# Patient Record
Sex: Male | Born: 1990 | Race: Black or African American | Hispanic: No
Health system: Midwestern US, Community
[De-identification: ages and names within clinical notes are randomized; demographics above are authoritative.]

## PROBLEM LIST (undated history)

## (undated) DIAGNOSIS — J45909 Unspecified asthma, uncomplicated: Secondary | ICD-10-CM

---
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
## 2011-08-23 ENCOUNTER — Emergency Department (HOSPITAL_COMMUNITY)
Admission: EM | Admit: 2011-08-23 | Discharge: 2011-08-24 | Disposition: A | Discharge status: Home / Self Care | Payer: Self-pay | Attending: Emergency Medicine | Admitting: Emergency Medicine

## 2011-08-23 ENCOUNTER — Emergency Department (HOSPITAL_COMMUNITY): Payer: Self-pay

## 2011-08-23 DIAGNOSIS — R51 Headache: Secondary | ICD-10-CM | POA: Insufficient documentation

## 2011-08-23 DIAGNOSIS — S0100XA Unspecified open wound of scalp, initial encounter: Secondary | ICD-10-CM | POA: Insufficient documentation

## 2011-08-23 DIAGNOSIS — S0120XA Unspecified open wound of nose, initial encounter: Secondary | ICD-10-CM | POA: Insufficient documentation

## 2011-08-23 DIAGNOSIS — R404 Transient alteration of awareness: Secondary | ICD-10-CM | POA: Insufficient documentation

## 2011-08-23 DIAGNOSIS — S02400A Malar fracture unspecified, initial encounter for closed fracture: Secondary | ICD-10-CM | POA: Insufficient documentation

## 2011-08-23 DIAGNOSIS — S02401A Maxillary fracture, unspecified, initial encounter for closed fracture: Secondary | ICD-10-CM | POA: Insufficient documentation

## 2011-10-04 ENCOUNTER — Emergency Department (HOSPITAL_COMMUNITY)
Admission: EM | Admit: 2011-10-04 | Discharge: 2011-10-04 | Disposition: A | Discharge status: Home / Self Care | Payer: Self-pay | Attending: Emergency Medicine | Admitting: Emergency Medicine

## 2011-10-04 DIAGNOSIS — R21 Rash and other nonspecific skin eruption: Secondary | ICD-10-CM

## 2011-10-04 MED ORDER — FAMOTIDINE 20 MG PO TABS: 20.0000 mg | ORAL_TABLET | Freq: Two times a day (BID) | ORAL | Status: AC

## 2011-10-04 MED ORDER — PREDNISONE 10 MG PO TABS: ORAL_TABLET | ORAL | Status: AC

## 2011-10-04 MED ORDER — PERMETHRIN 5 % EX CREA: TOPICAL_CREAM | CUTANEOUS | Status: AC

## 2011-10-04 NOTE — ED Provider Notes (Cosign Needed)
History     CSN: [id number] Arrival date & time: 10/04/2011  7:53 PM   First MD Initiated Contact with Patient 10/04/11 2012      Chief Complaint  Patient presents with  . Rash    (Consider location/radiation/quality/duration/timing/severity/associated sxs/prior treatment) HPI  Per patient's girlfriend her toddler son went to [place] with his grandmother and came back with a rash which she didn't develop. They have both been treated for scabies twice however they still have symptoms. Patient moved in with her a couple weeks ago and he also started getting a rash. His rash is only located however on his proximal upper arms bilaterally and around his neck. He states it is itching and denies any fever. There's no pets in the house they did change bar soap that they do not feel like that the cause of the itching. No chemical exposure at work  They have inspected the house for bed bugs. Nothing makes it feel worse nothing makes it feel better.  History reviewed. No pertinent past medical history.  History reviewed. No pertinent past surgical history.  History reviewed. No pertinent family history.  History  Substance Use Topics  . Smoking status: Never Smoker   . Smokeless tobacco: Not on file  . Alcohol Use: No  employed    Review of Systems  All other systems reviewed and are negative.    Allergies  Review of patient's allergies indicates no known allergies.  Home Medications  No current outpatient prescriptions on file.  BP 135/67  Pulse 82  Temp(Src) 97.8 F (36.6 C) (Oral)  Resp 16  Ht 6' (1.829 m)  Wt 145 lb (65.772 kg)  BMI 19.67 kg/m2  SpO2 100%  Physical Exam  Vitals reviewed. Constitutional: He is oriented to person, place, and time. He appears well-developed and well-nourished.  HENT:  Head: Normocephalic and atraumatic.  Eyes: Conjunctivae and EOM are normal. Pupils are equal, round, and reactive to light.  Neck: Normal range of motion. Neck supple.   Pulmonary/Chest: Effort normal.  Neurological: He is alert and oriented to person, place, and time.  Skin: Skin is warm and dry.       Pt  is a few papular type lesions on his upper arms he has some excoriated areas also he has a few lesions at the base of his neck and upper chest. He does not have lesions on his hands or between his fingers or at his waistline however his girlfriend does.    ED Course  Procedures (including critical care time)  Labs Reviewed - No data to display No results found.   The rash sounds like it may be scabies on the description of the rash on the girlfriend however could have an allergic component.  Diagnoses that have been ruled out:  Diagnoses that are still under consideration:  Final diagnoses:  Rash    Medications  diphenhydrAMINE (BENADRYL) 25 mg capsule (not administered)  predniSONE (DELTASONE) 10 MG tablet (not administered)  famotidine (PEPCID) 20 MG tablet (not administered)  permethrin (ELIMITE) 5 % cream (not administered)   Devoria Albe, MD, FACEP    MDM          Ward Givens, MD 10/04/11 2151

## 2011-10-04 NOTE — ED Notes (Signed)
Pt presents with rash to generalized body. Pt unsure of cause.

## 2011-10-04 NOTE — ED Notes (Signed)
C/o rash.

## 2012-09-27 ENCOUNTER — Encounter (HOSPITAL_COMMUNITY): Payer: Self-pay | Admitting: *Deleted

## 2012-09-27 ENCOUNTER — Emergency Department (HOSPITAL_COMMUNITY): Payer: No Typology Code available for payment source

## 2012-09-27 ENCOUNTER — Emergency Department (HOSPITAL_COMMUNITY)
Admission: EM | Admit: 2012-09-27 | Discharge: 2012-09-27 | Disposition: A | Discharge status: Home / Self Care | Payer: No Typology Code available for payment source | Attending: Emergency Medicine | Admitting: Emergency Medicine

## 2012-09-27 DIAGNOSIS — S40019A Contusion of unspecified shoulder, initial encounter: Secondary | ICD-10-CM

## 2012-09-27 DIAGNOSIS — S335XXA Sprain of ligaments of lumbar spine, initial encounter: Secondary | ICD-10-CM | POA: Insufficient documentation

## 2012-09-27 DIAGNOSIS — S39012A Strain of muscle, fascia and tendon of lower back, initial encounter: Secondary | ICD-10-CM

## 2012-09-27 DIAGNOSIS — S20219A Contusion of unspecified front wall of thorax, initial encounter: Secondary | ICD-10-CM

## 2012-09-27 DIAGNOSIS — Y92410 Unspecified street and highway as the place of occurrence of the external cause: Secondary | ICD-10-CM | POA: Insufficient documentation

## 2012-09-27 MED ORDER — HYDROCODONE-ACETAMINOPHEN 5-325 MG PO TABS: 1.0000 | ORAL_TABLET | Freq: Four times a day (QID) | ORAL | Status: AC | PRN

## 2012-09-27 MED ORDER — IBUPROFEN 800 MG PO TABS: 800.0000 mg | ORAL_TABLET | Freq: Three times a day (TID) | ORAL | Status: AC | PRN
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
## 2012-10-02 NOTE — ED Provider Notes (Signed)
Medical screening examination/treatment/procedure(s) were performed by non-physician practitioner and as supervising physician I was immediately available for consultation/collaboration.   Hurman Horn, MD 10/02/12 2124

## 2015-04-05 ENCOUNTER — Emergency Department (HOSPITAL_COMMUNITY): Payer: Self-pay

## 2015-04-05 ENCOUNTER — Emergency Department (HOSPITAL_COMMUNITY)
Admission: EM | Admit: 2015-04-05 | Discharge: 2015-04-06 | Discharge status: Against Medical Advice | Payer: No Typology Code available for payment source | Attending: Emergency Medicine | Admitting: Emergency Medicine

## 2015-04-05 ENCOUNTER — Encounter (HOSPITAL_COMMUNITY): Payer: Self-pay | Admitting: *Deleted

## 2015-04-05 DIAGNOSIS — R0981 Nasal congestion: Secondary | ICD-10-CM | POA: Insufficient documentation

## 2015-04-05 DIAGNOSIS — Z5321 Procedure and treatment not carried out due to patient leaving prior to being seen by health care provider: Secondary | ICD-10-CM

## 2015-04-05 DIAGNOSIS — J45909 Unspecified asthma, uncomplicated: Secondary | ICD-10-CM | POA: Insufficient documentation

## 2015-04-05 DIAGNOSIS — R05 Cough: Secondary | ICD-10-CM | POA: Insufficient documentation

## 2015-04-05 HISTORY — DX: Unspecified asthma, uncomplicated: J45.909

## 2015-04-05 NOTE — ED Notes (Signed)
Pt in c/o cough and congestion for the last few days, states he has been taking allergy medication without relief, no distress noted

## 2015-04-05 NOTE — ED Notes (Signed)
Pts name called for a room per RN no answer

## 2017-03-24 ENCOUNTER — Emergency Department (HOSPITAL_COMMUNITY): Payer: Self-pay

## 2017-03-24 ENCOUNTER — Encounter (HOSPITAL_COMMUNITY): Payer: Self-pay

## 2017-03-24 ENCOUNTER — Emergency Department (HOSPITAL_COMMUNITY)
Admission: EM | Admit: 2017-03-24 | Discharge: 2017-03-24 | Disposition: A | Discharge status: Home / Self Care | Payer: Self-pay | Attending: Emergency Medicine | Admitting: Emergency Medicine

## 2017-03-24 DIAGNOSIS — R0789 Other chest pain: Secondary | ICD-10-CM | POA: Insufficient documentation

## 2017-03-24 DIAGNOSIS — J45909 Unspecified asthma, uncomplicated: Secondary | ICD-10-CM | POA: Insufficient documentation

## 2017-03-24 DIAGNOSIS — J069 Acute upper respiratory infection, unspecified: Secondary | ICD-10-CM | POA: Insufficient documentation

## 2017-03-24 DIAGNOSIS — Z79899 Other long term (current) drug therapy: Secondary | ICD-10-CM | POA: Insufficient documentation

## 2017-03-24 MED ORDER — OXYMETAZOLINE HCL 0.05 % NA SOLN
1.0000 | Freq: Once | NASAL | Status: AC
  Administered 2017-03-24: 1 via NASAL
  Filled 2017-03-24: qty 15

## 2017-03-24 MED ORDER — ALBUTEROL SULFATE (2.5 MG/3ML) 0.083% IN NEBU
5.0000 mg | INHALATION_SOLUTION | Freq: Once | RESPIRATORY_TRACT | Status: AC
Start: 2017-03-24 — End: 2017-03-24
  Administered 2017-03-24: 5 mg via RESPIRATORY_TRACT
  Filled 2017-03-24: qty 6

## 2017-03-24 MED ORDER — IPRATROPIUM BROMIDE 0.02 % IN SOLN
0.5000 mg | Freq: Once | RESPIRATORY_TRACT | Status: AC
  Administered 2017-03-24: 0.5 mg via RESPIRATORY_TRACT
  Filled 2017-03-24: qty 2.5

## 2017-03-24 MED ORDER — ALBUTEROL SULFATE HFA 108 (90 BASE) MCG/ACT IN AERS
2.0000 | INHALATION_SPRAY | RESPIRATORY_TRACT | Status: DC | PRN
  Administered 2017-03-24: 2 via RESPIRATORY_TRACT
  Filled 2017-03-24: qty 6.7

## 2017-03-24 NOTE — ED Triage Notes (Signed)
States nasal congestion/drainage for a couple of days and worse today lungs clear able to speak in full sentences non productive cough noted. No fever.

## 2017-03-24 NOTE — ED Provider Notes (Signed)
[facility]-EMERGENCY DEPT Provider Note   CSN: [id number] Arrival date & time: 03/24/17  0341     History   Chief Complaint Chief Complaint  Patient presents with  . Nasal Congestion    HPI Joel Olson is a 26 y.o. male.  Patient with history of bronchitis and asthma, no home medications, presents with 2 day history of nasal congestion, sore throat, wheezing, chest tightness, cough, chills and subjective fevers. Denies any sick contacts. No treatments prior to arrival. No nausea, vomiting, or diarrhea. No abdominal pain or urinary symptoms. No skin rash. Onset of symptoms acute. Course is constant.      Past Medical History:  Diagnosis Date  . Asthma     There are no active problems to display for this patient.   History reviewed. No pertinent surgical history.     Home Medications    Prior to Admission medications   Medication Sig Start Date End Date Taking? Authorizing Provider  diphenhydrAMINE (BENADRYL) 25 mg capsule Take 25 mg by mouth as needed. For itching     Historical Provider, MD  famotidine (PEPCID) 20 MG tablet Take 1 tablet (20 mg total) by mouth 2 (two) times daily. 10/04/11 10/03/12  Devoria Albe, MD  HYDROcodone-acetaminophen (NORCO/VICODIN) 5-325 MG per tablet Take 1 tablet by mouth every 6 (six) hours as needed for pain. 09/27/12   Charlestine Night, PA-C  ibuprofen (ADVIL,MOTRIN) 800 MG tablet Take 1 tablet (800 mg total) by mouth every 8 (eight) hours as needed for pain. 09/27/12   Charlestine Night, PA-C  predniSONE (DELTASONE) 10 MG tablet Take 3 po QD x 3d  then 2 po QD x 3d then 1 po QD x 3d 10/04/11   Devoria Albe, MD    Family History History reviewed. No pertinent family history.  Social History Social History  Substance Use Topics  . Smoking status: Never Smoker  . Smokeless tobacco: Never Used  . Alcohol use No     Allergies   Patient has no known allergies.   Review of Systems Review of Systems  Constitutional: Positive for  chills. Negative for fatigue and fever (subjective).  HENT: Positive for congestion and sore throat. Negative for ear pain, rhinorrhea and sinus pressure.   Eyes: Negative for redness.  Respiratory: Positive for cough, chest tightness, shortness of breath and wheezing.   Gastrointestinal: Negative for abdominal pain, diarrhea, nausea and vomiting.  Genitourinary: Negative for dysuria.  Musculoskeletal: Negative for myalgias and neck stiffness.  Skin: Negative for rash.  Neurological: Negative for headaches.  Hematological: Negative for adenopathy.     Physical Exam Updated Vital Signs BP (!) 149/82 (BP Location: Left Arm)   Pulse 83   Temp 97.4 F (36.3 C) (Oral)   Resp 18   Ht 6' (1.829 m)   Wt 79.4 kg   SpO2 99%   BMI 23.73 kg/m   Physical Exam  Constitutional: He appears well-developed and well-nourished.  HENT:  Head: Normocephalic and atraumatic.  Right Ear: Tympanic membrane, external ear and ear canal normal.  Left Ear: Tympanic membrane, external ear and ear canal normal.  Nose: Mucosal edema present. No rhinorrhea.  Mouth/Throat: Uvula is midline, oropharynx is clear and moist and mucous membranes are normal. Mucous membranes are not dry. No trismus in the jaw. No uvula swelling. No oropharyngeal exudate, posterior oropharyngeal edema, posterior oropharyngeal erythema or tonsillar abscesses.  Eyes: Conjunctivae are normal. Right eye exhibits no discharge. Left eye exhibits no discharge.  Neck: Normal range of motion. Neck supple.  Cardiovascular: Normal rate, regular rhythm and normal heart sounds.   Pulmonary/Chest: Effort normal and breath sounds normal. No respiratory distress. He has no wheezes. He has no rales.  Slightly decreased air movement  Abdominal: Soft. There is no tenderness.  Musculoskeletal: He exhibits no edema.  Neurological: He is alert.  Skin: Skin is warm and dry.  Psychiatric: He has a normal mood and affect.  Nursing note and vitals  reviewed.    ED Treatments / Results   Radiology Dg Chest 2 View  Result Date: 03/24/2017 CLINICAL DATA:  Chest tightness EXAM: CHEST  2 VIEW COMPARISON:  Chest radiograph 04/05/2015 FINDINGS: The heart size and mediastinal contours are within normal limits. Both lungs are clear. The visualized skeletal structures are unremarkable. IMPRESSION: Clear lungs. Electronically Signed   By: Deatra Robinson M.D.   On: 03/24/2017 04:31    Procedures Procedures (including critical care time)  Medications Ordered in ED Medications  albuterol (PROVENTIL HFA;VENTOLIN HFA) 108 (90 Base) MCG/ACT inhaler 2 puff (2 puffs Inhalation Given 03/24/17 0533)  oxymetazoline (AFRIN) 0.05 % nasal spray 1 spray (1 spray Each Nare Given 03/24/17 0447)  albuterol (PROVENTIL) (2.5 MG/3ML) 0.083% nebulizer solution 5 mg (5 mg Nebulization Given 03/24/17 0431)  ipratropium (ATROVENT) nebulizer solution 0.5 mg (0.5 mg Nebulization Given 03/24/17 0431)     Initial Impression / Assessment and Plan / ED Course  I have reviewed the triage vital signs and the nursing notes.  Pertinent labs & imaging results that were available during my care of the patient were reviewed by me and considered in my medical decision making (see chart for details).     Patient seen and examined. Work-up initiated. Medications ordered.   Vital signs reviewed and are as follows: BP (!) 149/82 (BP Location: Left Arm)   Pulse 83   Temp 97.4 F (36.3 C) (Oral)   Resp 18   Ht 6' (1.829 m)   Wt 79.4 kg   SpO2 99%   BMI 23.73 kg/m   CXR negative. Patient informed. Nasal congestion improved with Afrin. Discussed how to use this more than twice a day for 3 days. Will discharge to home with albuterol inhaler to use as needed for wheezing.  Patient counseled on supportive care for viral URI and s/s to return including worsening symptoms, persistent fever, persistent vomiting, or if they have any other concerns. Urged to see PCP if symptoms  persist for more than 3 days. Patient verbalizes understanding and agrees with plan.    Final Clinical Impressions(s) / ED Diagnoses   Final diagnoses:  Upper respiratory tract infection, unspecified type   Patient with symptoms consistent with a viral syndrome. Vitals are stable, no fever. No signs of dehydration. Lung exam normal, CXR clear. Supportive therapy indicated with return if symptoms worsen.     New Prescriptions New Prescriptions   No medications on file     Renne Crigler, PA-C 03/24/17 6578    Derwood Kaplan, MD 03/25/17 [phone]

## 2017-03-24 NOTE — Discharge Instructions (Signed)
Please read and follow all provided instructions.  Your diagnoses today include:  1. Upper respiratory tract infection, unspecified type     You appear to have an upper respiratory infection (URI). An upper respiratory tract infection, or cold, is a viral infection of the air passages leading to the lungs. It should improve gradually after 5-7 days. You may have a lingering cough that lasts for 2- 4 weeks after the infection.  Tests performed today include:  Vital signs. See below for your results today.   Medications prescribed:   Albuterol inhaler - medication that opens up your airway  Use inhaler as follows: 1-2 puffs with spacer every 4 hours as needed for wheezing, cough, or shortness of breath.    Oxymetazoline - nasal spray for congestion. Do not use for more than 3 days because this medicine can cause rebound congestion.   Take any prescribed medications only as directed. Treatment for your infection is aimed at treating the symptoms. There are no medications, such as antibiotics, that will cure your infection.   Home care instructions:  Follow any educational materials contained in this packet.   Your illness is contagious and can be spread to others, especially during the first 3 or 4 days. It cannot be cured by antibiotics or other medicines. Take basic precautions such as washing your hands often, covering your mouth when you cough or sneeze, and avoiding public places where you could spread your illness to others.   Please continue drinking plenty of fluids.  Use over-the-counter medicines as needed as directed on packaging for symptom relief.  You may also use ibuprofen or tylenol as directed on packaging for pain or fever.  Do not take multiple medicines containing Tylenol or acetaminophen to avoid taking too much of this medication.  Follow-up instructions: Please follow-up with your primary care provider in the next 3 days for further evaluation of your symptoms if  you are not feeling better.   Return instructions:   Please return to the Emergency Department if you experience worsening symptoms.   RETURN IMMEDIATELY IF you develop shortness of breath, confusion or altered mental status, a new rash, become dizzy, faint, or poorly responsive, or are unable to be cared for at home.  Please return if you have persistent vomiting and cannot keep down fluids or develop a fever that is not controlled by tylenol or motrin.    Please return if you have any other emergent concerns.  Additional Information:  Your vital signs today were: BP (!) 149/82 (BP Location: Left Arm)    Pulse 83    Temp 97.4 F (36.3 C) (Oral)    Resp 18    Ht 6' (1.829 m)    Wt 79.4 kg    SpO2 99%    BMI 23.73 kg/m  If your blood pressure (BP) was elevated above 135/85 this visit, please have this repeated by your doctor within one month. --------------

## 2017-03-24 NOTE — ED Notes (Signed)
Respiratory notified of nebs ordered.

## 2017-03-24 NOTE — ED Notes (Signed)
Bed: [facility] Expected date:  Expected time:  Means of arrival:  Comments: 

## 2017-09-14 IMAGING — CR DG CHEST 2V
2 series · 2 of 2 positions shown · non-contrast
Comparison: Chest radiograph 04/05/2015

CLINICAL DATA: Chest tightness

EXAM:
CHEST  2 VIEW

[w chest pa]
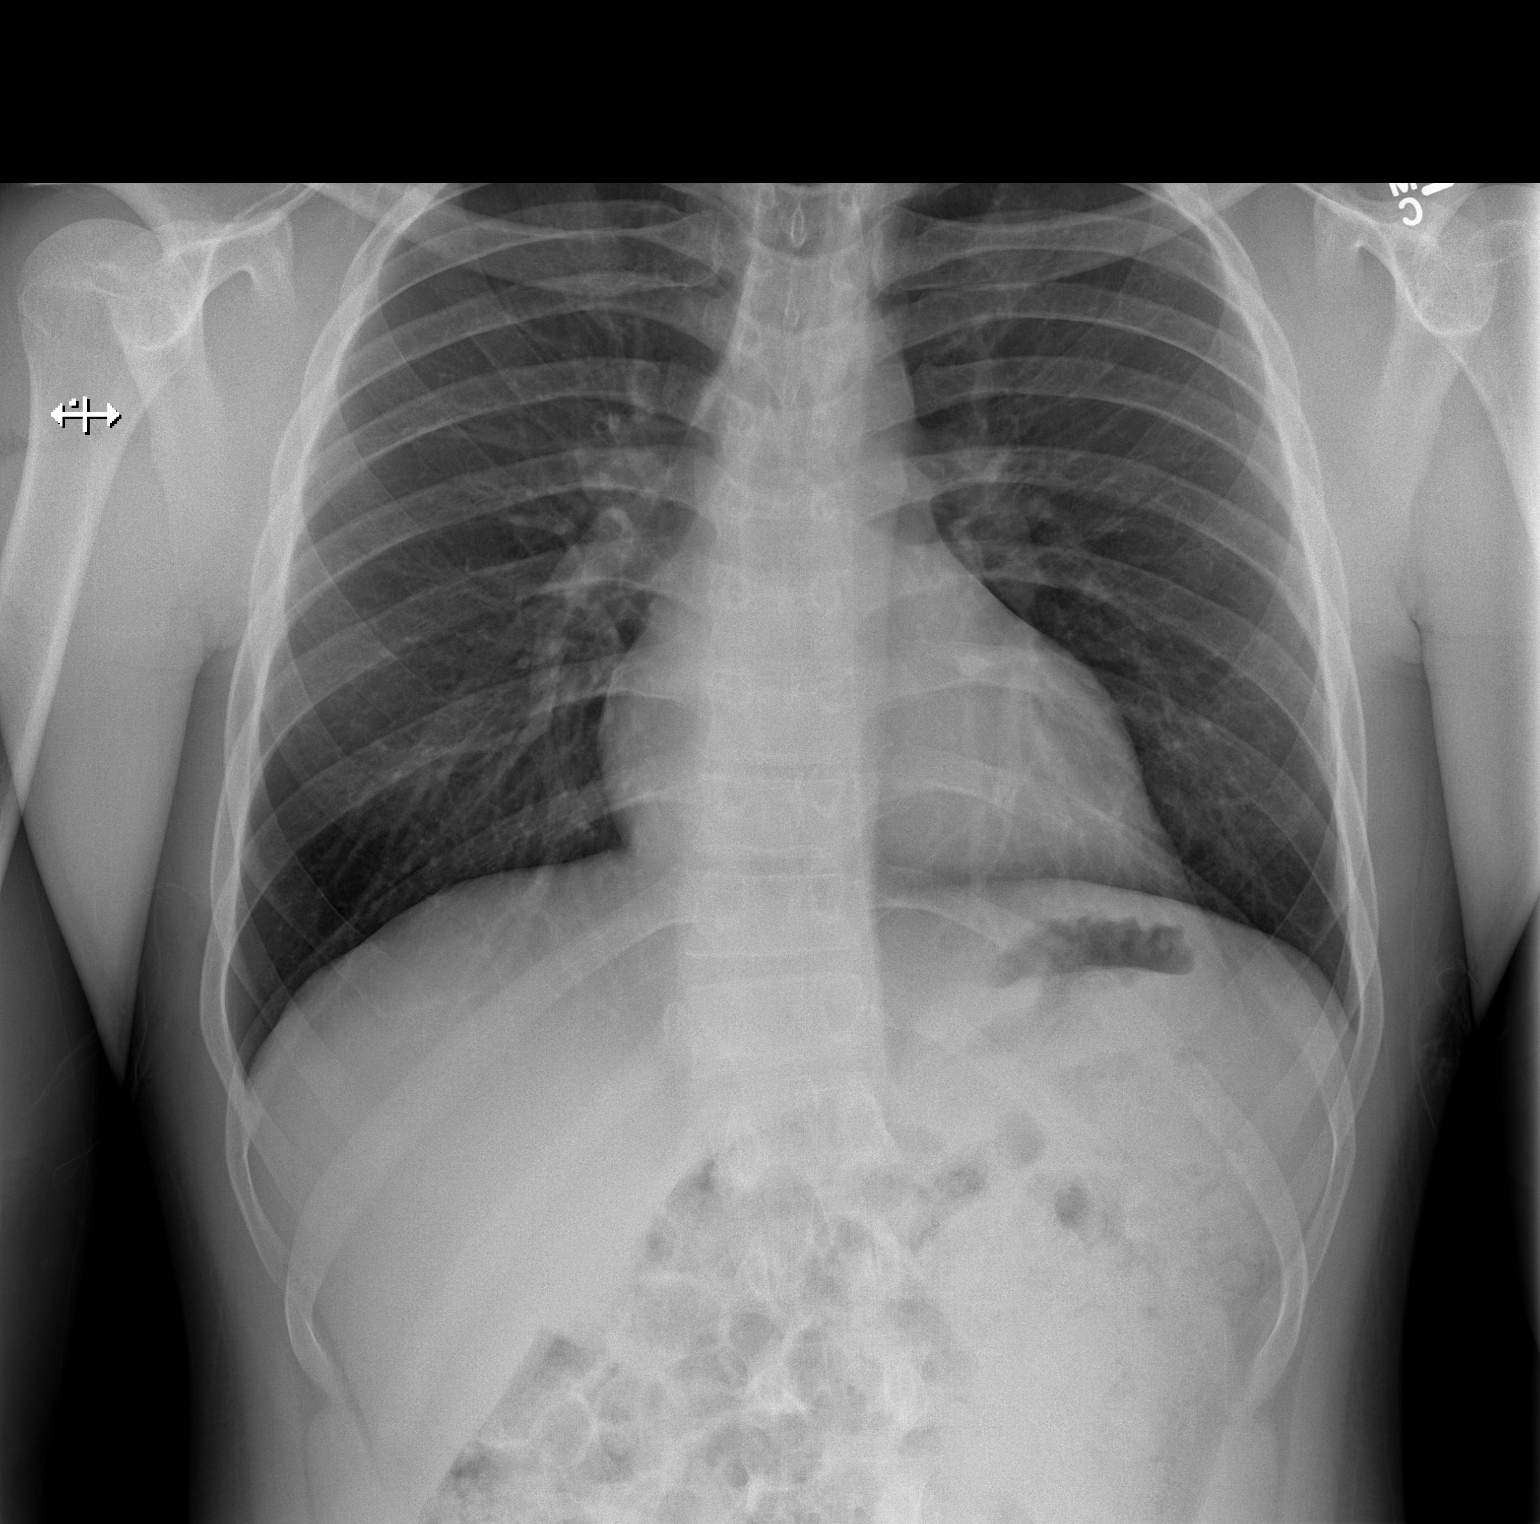

[w chest lat]
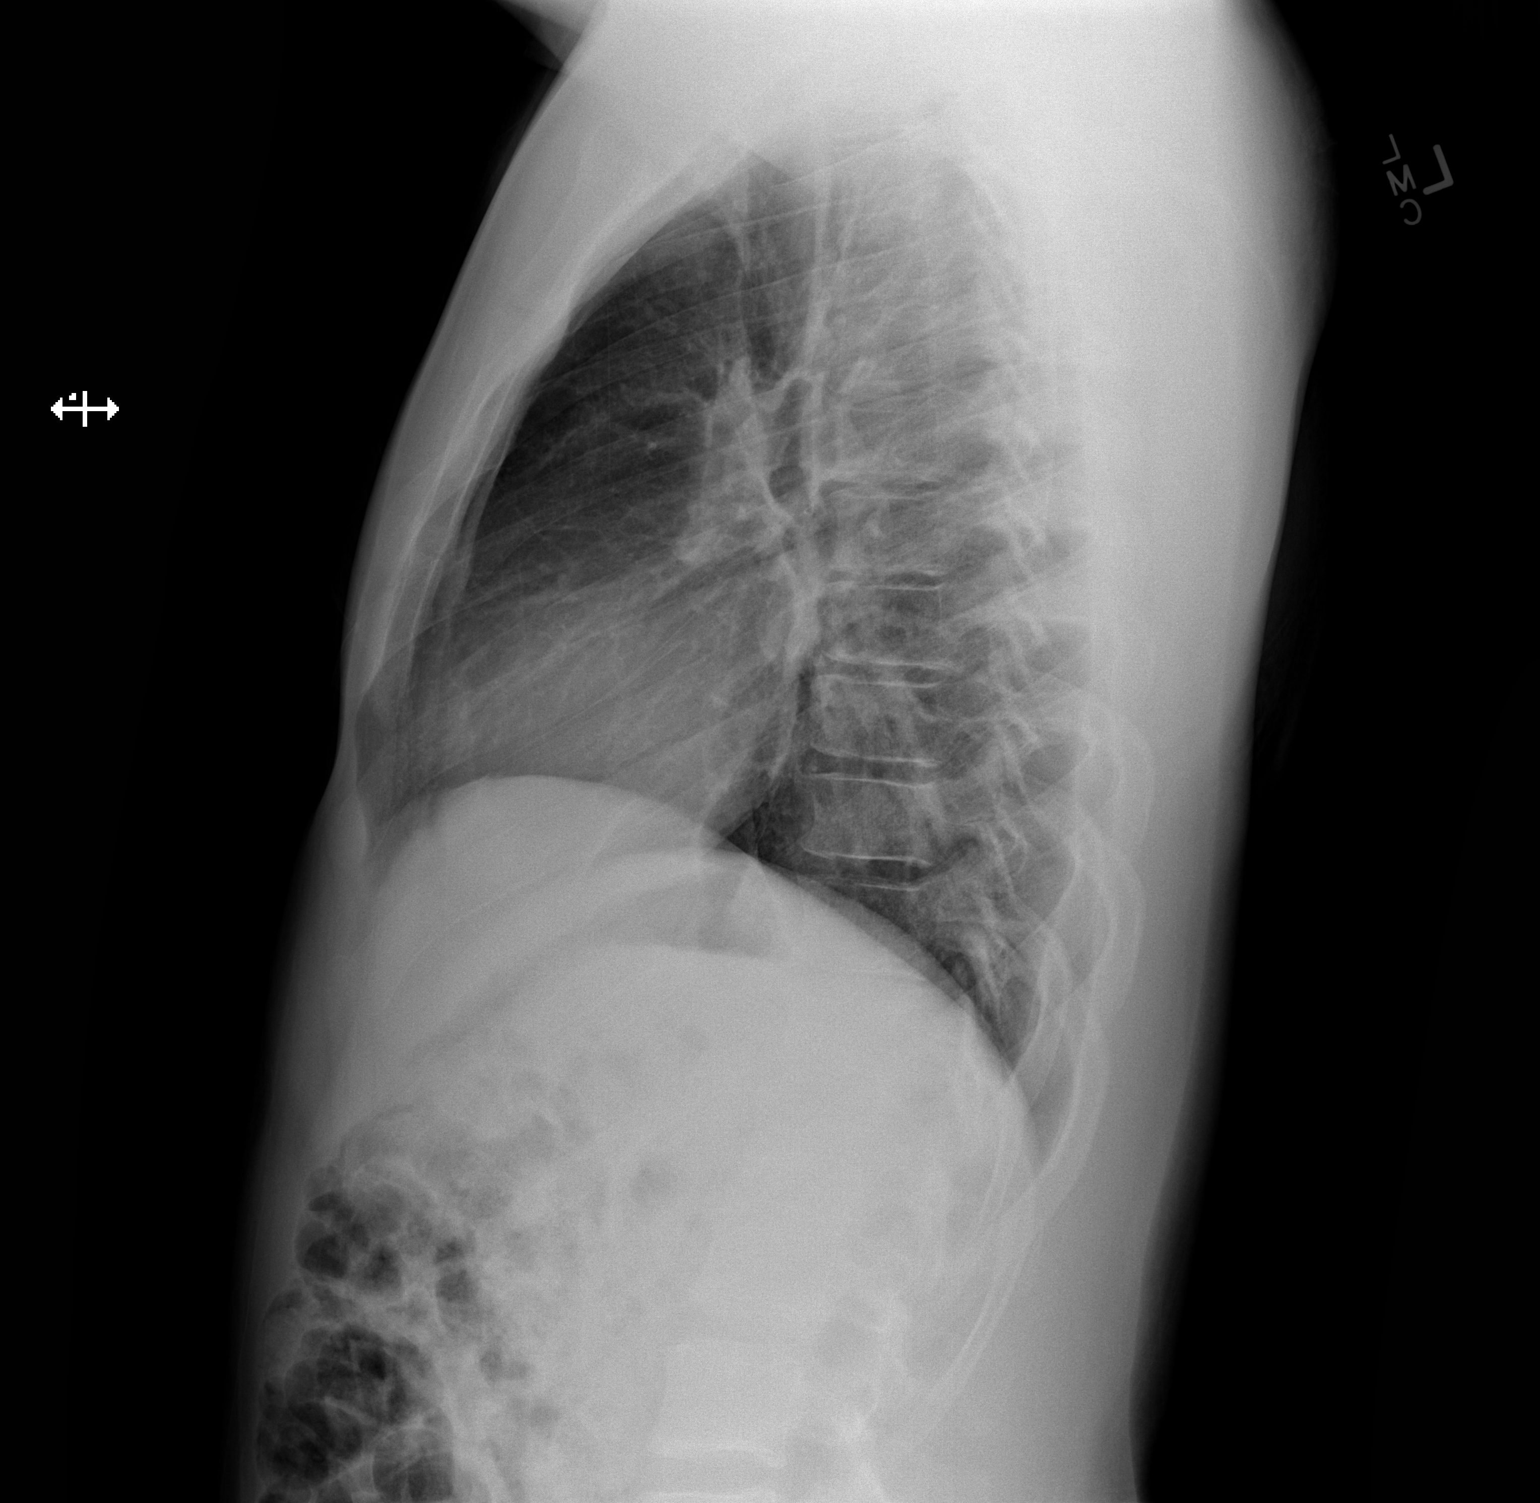

[2 of 2 positions shown; findings below may reference images not displayed]

FINDINGS: The heart size and mediastinal contours are within normal limits.
Both lungs are clear. The visualized skeletal structures are
unremarkable.
IMPRESSION: Clear lungs.

## 2018-05-19 MED ORDER — PSEUDOEPH-BROMPHEN-DM 30-2-10 MG/5ML PO SYRP
2-30-10 MG/5ML | Freq: Four times a day (QID) | ORAL | 0 refills | Status: DC | PRN
Start: 2018-05-19 — End: 2019-04-05

## 2018-05-19 MED ORDER — METHYLPREDNISOLONE ACETATE 40 MG/ML IJ SUSP
40 MG/ML | Freq: Once | INTRAMUSCULAR | Status: AC
Start: 2018-05-19 — End: 2018-05-19
  Administered 2018-05-19: 15:00:00 80 mg via INTRAMUSCULAR

## 2018-05-19 MED ORDER — DOXYCYCLINE HYCLATE 100 MG PO TABS
100 MG | ORAL_TABLET | Freq: Two times a day (BID) | ORAL | 0 refills | Status: AC
Start: 2018-05-19 — End: 2018-05-29

## 2018-05-19 NOTE — Progress Notes (Signed)
05/19/18  Alec Mcneil DOB: [birth year] Sex: male  Age 27 y.o.      Subjective:  Chief Complaint   Patient presents with   . Cough   . Congestion         HPI:   Alec Mcneil , 27 y.o. male presents to express care for evaluation of cough, congestion, rhinorrhea.  The patient has had the symptoms ongoing for the last week or so.  The patient states that the symptoms are really not improving.  He has been using over-the-counter medications without any improvement.  The patient has been using DayQuil, NyQuil, and Vicks VapoRub.  The patient denies any other sick contacts at home.  He is relatively healthy young male.  He is not currently on any medications.  He does not have any history of lung pathology.  He is not a smoker.      ROS:   Unless otherwise stated in this report the patient's positive and negative responses for review of systems for constitutional, eyes, ENT, cardiovascular, respiratory, gastrointestinal, neurological, GU, musculoskeletal, and integument systems and related systems to the presenting problem are either stated in the history of present illness or were not pertinent or were negative for the symptoms and/or complaints related to the presenting medical problem.  Positives and pertinent negatives as per HPI.  All others reviewed and are negative.      PMH:   History reviewed. No pertinent past medical history.    History reviewed. No pertinent surgical history.  Medications:     Current Outpatient Medications:   .  brompheniramine-pseudoephedrine-DM 2-30-10 MG/5ML syrup, Take 5 mLs by mouth 4 times daily as needed for Congestion or Cough, Disp: 120 mL, Rfl: 0  .  doxycycline hyclate (VIBRA-TABS) 100 MG tablet, Take 1 tablet by mouth 2 times daily for 10 days, Disp: 20 tablet, Rfl: 0    Allergies:   No Known Allergies    Social History:     Social History     Tobacco Use   . Smoking status: Never Smoker   Substance Use Topics   . Alcohol use: Yes     Frequency: 2-4 times a month   . Drug use: Never        Physical Exam:     Vitals:    05/19/18 1029   BP: 126/80   Site: Right Upper Arm   Position: Sitting   Pulse: 77   Temp: 97.4 F (36.3 C)   SpO2: 96%   Weight: 175 lb 6.4 oz (79.6 kg)   Height: 6' (1.829 m)       Exam:  Physical Exam  Nurse's notes and vital signs reviewed. The patient is not hypoxic.    General: Alert, no acute distress, patient resting comfortably Patient is not toxic or lethargic.  Skin: Warm, intact, no pallor noted. There is no evidence of rash at this time.  Head: Normocephalic, atraumatic.  Eye: Normal conjunctiva  Ears, Nose, Throat: Right tympanic membrane clear, left tympanic membrane clear. No drainage or discharge noted. No pre- or post-auricular tenderness, erythema, or swelling noted.   Moderate nasal congestion, rhinorrhea. No facial erythema.  Posterior oropharynx shows erythema and cobblestoning, but no hypertrophy, asymmetry or peritonsillar abscess and no evidence of exudate. the uvula is midline. No trismus or drooling is noted. Moist mucous membranes.  Neck: No anterior/posterior lymphadenopathy noted. No erythema, no masses, no fluctuance or induration noted. No meningeal signs.  Cardio: Regular Rate and Rhythm  Respiratory: No acute distress,  no rhonchi, wheezing or crackles noted. No stridor or retractions are noted.  Neurological: A&O x4, normal speech  Psychiatric: Cooperative         Testing:           Medical Decision Making:     The patient has been seen and evaluated. The vital signs have been reviewed. The patient does not appear to be toxic or lethargic.  Patient will be treated with intramuscular injection of methylprednisone.  Patient will be started on Bromfed and given prescription for doxycycline.    The patient was educated on the proper dosage of motrin and tylenol and the appropriate intervals of each. The patient is to increase fluid intake over the next several days. The patient is to use OTC nasal spray.    The patient is to return to express care or  go directly to the emergency department should any of the signs or symptoms worsen. The patient is to followup with primary care physician in 2-3 days for repeat evaluation. The patient has no other questions or concerns at this time the patient will be discharged home.        Clinical Impression:   [name] was seen today for cough and congestion.    Diagnoses and all orders for this visit:    Upper respiratory tract infection, unspecified type    Acute non-recurrent sinusitis, unspecified location    Other orders  -     methylPREDNISolone acetate (DEPO-MEDROL) injection 80 mg  -     brompheniramine-pseudoephedrine-DM 2-30-10 MG/5ML syrup; Take 5 mLs by mouth 4 times daily as needed for Congestion or Cough  -     doxycycline hyclate (VIBRA-TABS) 100 MG tablet; Take 1 tablet by mouth 2 times daily for 10 days        The patient is to call for any concerns or return if any of the signs or symptoms worsen. The patient is to follow-up with PCP in the next 2-3 days for repeat evaluation repeat assessment or go directly to the emergency department.     SIGNATURE: Alben Spittle III, PA-C

## 2019-04-05 ENCOUNTER — Ambulatory Visit: Admit: 2019-04-05 | Discharge: 2019-04-05 | Payer: PRIVATE HEALTH INSURANCE | Attending: Physician Assistant

## 2019-04-05 DIAGNOSIS — J069 Acute upper respiratory infection, unspecified: Secondary | ICD-10-CM

## 2019-04-05 MED ORDER — ALBUTEROL SULFATE HFA 108 (90 BASE) MCG/ACT IN AERS
108 (90 Base) MCG/ACT | Freq: Four times a day (QID) | RESPIRATORY_TRACT | 0 refills | Status: DC | PRN
Start: 2019-04-05 — End: 2020-02-28

## 2019-04-05 MED ORDER — PROMETHAZINE-DM 6.25-15 MG/5ML PO SYRP
Freq: Four times a day (QID) | ORAL | 0 refills | Status: AC | PRN
Start: 2019-04-05 — End: 2019-04-12

## 2019-04-05 MED ORDER — AZITHROMYCIN 250 MG PO TABS
250 MG | ORAL_TABLET | ORAL | 0 refills | Status: AC
Start: 2019-04-05 — End: 2019-04-10

## 2019-04-05 NOTE — Patient Instructions (Signed)
Patient advised to self quarantine and stay home until Apr 19, 2019.   Advance Care Planning  People with COVID-19 may have no symptoms, mild symptoms, such as fever, cough, and shortness of breath or they may have more severe illness, developing severe and fatal pneumonia.  As a result, Advance Care Planning with attention to naming a health care decision maker (someone you trust to make healthcare decisions for you if you could not speak for yourself) and sharing other health care preferences is important BEFORE a possible health crisis. Please contact your Primary Care Provider to discuss Advance Care Planning.    Preventing the Spread of Coronavirus Disease 2019 in Homes and Residential Communities  For the most recent information go to [email or web address]    Prevention steps for People with confirmed or suspected COVID-19 (including persons under investigation) who do not need to be hospitalized  and   People with confirmed COVID-19 who were hospitalized and determined to be medically stable to go home    Your healthcare provider and public health staff will evaluate whether you can be cared for at home. If it is determined that you do not need to be hospitalized and can be isolated at home, you will be monitored by staff from your local or state health department. You should follow the prevention steps below until a healthcare provider or local or state health department says you can return to your normal activities.  Stay home except to get medical care  People who are mildly ill with COVID-19 are able to isolate at home during their illness. You should restrict activities outside your home, except for getting medical care. Do not go to work, school, or public areas. Avoid using public transportation, ride-sharing, or taxis.  Separate yourself from other people and animals in your home  People: As much as possible, you should stay in a specific room and  away from other people in your home. Also, you should use a separate bathroom, if available.  Animals: You should restrict contact with pets and other animals while you are sick with COVID-19, just like you would around other people. Although there have not been reports of pets or other animals becoming sick with COVID-19, it is still recommended that people sick with COVID-19 limit contact with animals until more information is known about the virus. When possible, have another member of your household care for your animals while you are sick. If you are sick with COVID-19, avoid contact with your pet, including petting, snuggling, being kissed or licked, and sharing food. If you must care for your pet or be around animals while you are sick, wash your hands before and after you interact with pets and wear a facemask.  Call ahead before visiting your doctor  If you have a medical appointment, call the healthcare provider and tell them that you have or may have COVID-19. This will help the healthcare provider???s office take steps to keep other people from getting infected or exposed.  Wear a facemask  You should wear a facemask when you are around other people (e.g., sharing a room or vehicle) or pets and before you enter a healthcare provider???s office. If you are not able to wear a facemask (for example, because it causes trouble breathing), then people who live with you should not stay in the same room with you, or they should wear a facemask if they enter your room.  Cover your coughs and sneezes  Cover your mouth and nose with  a tissue when you cough or sneeze. Throw used tissues in a lined trash can. Immediately wash your hands with soap and water for at least 20 seconds or, if soap and water are not available, clean your hands with an alcohol-based hand sanitizer that contains at least 60% alcohol.  Clean your hands often  Wash your hands often with soap and water for at least 20 seconds, especially after  blowing your nose, coughing, or sneezing; going to the bathroom; and before eating or preparing food. If soap and water are not readily available, use an alcohol-based hand sanitizer with at least 60% alcohol, covering all surfaces of your hands and rubbing them together until they feel dry.  Soap and water are the best option if hands are visibly dirty. Avoid touching your eyes, nose, and mouth with unwashed hands.  Avoid sharing personal household items  You should not share dishes, drinking glasses, cups, eating utensils, towels, or bedding with other people or pets in your home. After using these items, they should be washed thoroughly with soap and water.  Clean all ???high-touch??? surfaces everyday  High touch surfaces include counters, tabletops, doorknobs, bathroom fixtures, toilets, phones, keyboards, tablets, and bedside tables. Also, clean any surfaces that may have blood, stool, or body fluids on them. Use a household cleaning spray or wipe, according to the label instructions. Labels contain instructions for safe and effective use of the cleaning product including precautions you should take when applying the product, such as wearing gloves and making sure you have good ventilation during use of the product.  Monitor your symptoms  Seek prompt medical attention if your illness is worsening (e.g., difficulty breathing). Before seeking care, call your healthcare provider and tell them that you have, or are being evaluated for, COVID-19. Put on a facemask before you enter the facility. These steps will help the healthcare provider???s office to keep other people in the office or waiting room from getting infected or exposed. Ask your healthcare provider to call the local or state health department. Persons who are placed under active monitoring or facilitated self-monitoring should follow instructions provided by their local health department or occupational health professionals, as appropriate. When working  with your local health department check their available hours.  If you have a medical emergency and need to call 911, notify the dispatch personnel that you have, or are being evaluated for COVID-19. If possible, put on a facemask before emergency medical services arrive.  Discontinuing home isolation  Patients with confirmed COVID-19 should remain under home isolation precautions until the risk of secondary transmission to others is thought to be low. The decision to discontinue home isolation precautions should be made on a case-by-case basis, in consultation with healthcare providers and state and local health departments.

## 2019-04-05 NOTE — Progress Notes (Signed)
Alec Mcneil  07-23-1991    Chief Complaint   Patient presents with   ??? Cough   ??? Congestion       Respiratory Symptoms:  Patient complains of 2 day(s) history of nasal congestion and non-productive cough. He thought he had a subjective fever and chills. He has a hx of asthma, and is starting to feel tight.   Symptoms have been worsening with time. He denies any other symptoms .      Relevant PMH: asthma    Smoking history:  He  reports that he has never smoked. He does not have any smokeless tobacco history on file.     He has had no known ill contacts.     Treatment to date: dayquil.    Travel screen completed:  Yes            Vitals:    04/05/19 1105   BP: 130/80   Pulse: 83   Resp: 16   Temp: 98.1 ??F (36.7 ??C)   TempSrc: Oral   SpO2: 98%   Weight: 175 lb (79.4 kg)   Height: 6' (1.829 m)      Physical Exam  Constitutional:       General: He is not in acute distress.     Appearance: He is well-developed.   HENT:      Head: Normocephalic and atraumatic.      Right Ear: External ear normal.      Left Ear: External ear normal.      Nose: Nose normal.   Eyes:      General: No scleral icterus.     Conjunctiva/sclera: Conjunctivae normal.      Pupils: Pupils are equal, round, and reactive to light.   Neck:      Musculoskeletal: Normal range of motion and neck supple.      Thyroid: No thyromegaly.   Cardiovascular:      Rate and Rhythm: Normal rate and regular rhythm.      Heart sounds: Normal heart sounds. No murmur.   Pulmonary:      Effort: Pulmonary effort is normal. No accessory muscle usage or respiratory distress.      Breath sounds: Normal breath sounds. No wheezing.   Musculoskeletal: Normal range of motion.   Skin:     General: Skin is warm and dry.      Findings: No rash.   Neurological:      Mental Status: He is alert and oriented to person, place, and time.      Deep Tendon Reflexes: Reflexes are normal and symmetric.   Psychiatric:         Speech: Speech normal.         Behavior: Behavior normal.               Assessment/Plan:  1. Upper respiratory tract infection, unspecified type  - azithromycin (ZITHROMAX) 250 MG tablet; Take 1 tablet by mouth See Admin Instructions for 5 days 500mg  on day 1 followed by 250mg  on days 2 - 5  Dispense: 6 tablet; Refill: 0  - promethazine-dextromethorphan (PROMETHAZINE-DM) 6.25-15 MG/5ML syrup; Take 5 mLs by mouth 4 times daily as needed for Cough  Dispense: 180 mL; Refill: 0    2. Mild intermittent asthma without complication  - albuterol sulfate HFA (VENTOLIN HFA) 108 (90 Base) MCG/ACT inhaler; Inhale 2 puffs into the lungs 4 times daily as needed for Wheezing  Dispense: 1 Inhaler; Refill: 0    14 day self quarantine advised. Return  to work note given for Apr 19, 2019. rto if sx worsen or fail to resolve.        Florinda Marker, PA-C  04/05/19     This visit was provided as a focused evaluation during the COVID -19 pandemic/national emergency.  A comprehensive review of all previous patient history and testing was not conducted.  Pertinent findings were elicited during the visit.

## 2019-04-05 NOTE — Telephone Encounter (Signed)
Pt has stuffy nose, cough, sore throat. Pt needs to be seen before he can return to work. I told pt to go straight to the flu clinic.

## 2019-04-09 NOTE — Telephone Encounter (Signed)
Left pt vm to schedule 2 weeks 5.11 is earliest.

## 2019-04-09 NOTE — Telephone Encounter (Signed)
[facility] Follow Up Call    Flu Clinic Visit Date:  04/05/2019    Patient: Alec Mcneil Patient DOB: March 08, 1991     PCP:  none    Spoke with: Patient    Interactive Patient Contact:  Was patient able to fill all prescriptions: yes  Is patient taking all medications as directed on the After Visit Summary? yes  Does patient understand their visit instructions: Yes  Does patient have questions or concerns that need addressed?: no  Follow-up testing completed?: N/A    Current patient status:   improved    Self-care instructions reviewed:  yes    Face to face required?  yes  Able to participate in virtual visit? yes    Additional patient instructions:   Patient states no wheezing or tightness in chest. Denies fever temp today 97.6  Patient states continues to have cough and occasional sob  Patient taking inhaler,antibiotic and cough medication as instructed  Patient drinking fluids,resting and keeping self quarantine.    Patient instructed if symptoms persist or worsen to follow up with flu clinic.  Patient instructed to go to emergency if any severe change in symptoms.  Patient verbalizes understanding.    Patient states he does not have a PCP at this time.  Patient given number for pre service to call to establish with a provider in his area   Discussed with patient that since he does not have a PCP and has medical HX of asthma, we will recommend follow up at [facility] office.  Encounter sent to [facility] scheduling for follow up in 1 week          Delton Prairie, RN   04/09/19

## 2019-04-09 NOTE — Telephone Encounter (Signed)
Message left for patient to call back regarding follow up 04/05/19 visit with [facility].

## 2019-04-14 ENCOUNTER — Encounter: Attending: Family Medicine

## 2019-04-14 NOTE — Telephone Encounter (Signed)
Called pt about missed appt

## 2019-04-14 NOTE — Progress Notes (Deleted)
Alec Mcneil  [birth year]    Date of Visit: 04/05/2019  Facility: [facility]     Clinic dx:  URI  Asthma    Significant PMH:    Asthma  Never smoker      Course:  Presented with 1-2 day cough and congestion  Seen and evaluated , no testing done  Vitals 130/80, pulse 83, resp16, temp 98.1, O2 sat 98%, weight 175#, height 72"    Significant Procedures/Tests/Lab results: none    New medications on discharge:    Z pak  Promethazine/dextromethorphan  Abuterol MDI    Other instructions:    14 day self quarantine    Ronelle Nigh, MD  04/14/2019  Patient was seen at [facility] flu clinic on 4/27 with c/o 1-2 day history of cough and congestion, diagnosed with uri, mild intermittent asthma, rx given for zpack, albuterol mdi and promethazine/dextromethorphan given. Recommended 14 day quarantine  There were no vitals filed for this visit.   Physical Exam  Constitutional:       Appearance: He is well-developed.   HENT:      Head: Normocephalic and atraumatic.      Right Ear: External ear normal.      Left Ear: External ear normal.      Nose: Nose normal.   Eyes:      Conjunctiva/sclera: Conjunctivae normal.      Pupils: Pupils are equal, round, and reactive to light.   Neck:      Musculoskeletal: Normal range of motion and neck supple.   Cardiovascular:      Rate and Rhythm: Normal rate and regular rhythm.      Heart sounds: Normal heart sounds.   Pulmonary:      Effort: Pulmonary effort is normal.      Breath sounds: Normal breath sounds.   Abdominal:      General: Bowel sounds are normal.      Palpations: Abdomen is soft.   Musculoskeletal: Normal range of motion.   Skin:     General: Skin is warm and dry.      Findings: No rash.   Neurological:      Mental Status: He is alert and oriented to person, place, and time.              Assessment/Plan:  1. Viral upper respiratory tract infection  ***    2. Mild intermittent asthma with acute exacerbation  ***    3. Suspected COVID-19 virus infection  ***           Ronelle Nigh,  MD  04/14/19       I have reviewed the chief complaint and history of present illness (including ROS and PFSH) and vital sign documentation by my staff and I agree with their documentation and have added information where applicable.

## 2019-10-10 ENCOUNTER — Inpatient Hospital Stay: Admit: 2019-10-10 | Discharge: 2019-10-10 | Disposition: A | Discharge status: Home / Self Care

## 2019-10-10 DIAGNOSIS — R079 Chest pain, unspecified: Secondary | ICD-10-CM

## 2019-10-10 MED ORDER — NAPROXEN 500 MG PO TABS
500 MG | Freq: Once | ORAL | Status: AC
Start: 2019-10-10 — End: 2019-10-10
  Administered 2019-10-10: 22:00:00 500 mg via ORAL

## 2019-10-10 MED FILL — NAPROXEN 500 MG PO TABS: 500 mg | ORAL | Qty: 1

## 2019-10-10 NOTE — ED Provider Notes (Signed)
Emergency DepartmentEncounter  [facility] EMERGENCY DEPT    Patient: Alec Mcneil  YQM:[id number]  DOB: 05-04-1991  Date of Evaluation: 10/10/2019  ED APP Provider: Juliet Rude, PA-C    I've seen this patient per my scope of practice with attending available at all times for consultation.  Protective eyewear, an N95 respirator, and gloves were worn during the entirety of this encounter.    Chief Complaint       Chief Complaint   Patient presents with   ??? Chest Pain     pt states he started having CP yesterday. and chills. pt would also like to have his R ankle checked out.   ??? Chills   ??? Ankle Pain     HOPI     Alec Mcneil is a 28 y.o. male who presents to the emergency department with complaints of chest pain, chills, right ankle pain.  Chest pain started yesterday, described as sharp in nature, radiates/10.  Constant.  No alleviating or brain factors.  He also complains of generalized chills that started yesterday.  No known COVID exposures.  No fever, shortness of breath, nausea, vomiting, abdominal pain, diarrhea.  He also complains of mild right ankle pain after an inversion injury that occurred several days ago.  Patient is able to ambulate with a limp, bearing little weight to the right foot.  Denies personal cardiac or lung history, no recent travel.  No history of IV drug use.    ROS:     Review of Systems  At least 10 systems reviewed and otherwise acutely negative except as in the HOPI.    Past History   No past medical history on file.  No past surgical history on file.  Social History     Socioeconomic History   ??? Marital status: Not on file     Spouse name: Not on file   ??? Number of children: Not on file   ??? Years of education: Not on file   ??? Highest education level: Not on file   Occupational History   ??? Not on file   Social Needs   ??? Financial resource strain: Not on file   ??? Food insecurity     Worry: Not on file     Inability: Not on file   ??? Transportation needs     Medical: Not on file      Non-medical: Not on file   Tobacco Use   ??? Smoking status: Not on file   Substance and Sexual Activity   ??? Alcohol use: Not on file   ??? Drug use: Not on file   ??? Sexual activity: Not on file   Lifestyle   ??? Physical activity     Days per week: Not on file     Minutes per session: Not on file   ??? Stress: Not on file   Relationships   ??? Social Wellsite geologist on phone: Not on file     Gets together: Not on file     Attends religious service: Not on file     Active member of club or organization: Not on file     Attends meetings of clubs or organizations: Not on file     Relationship status: Not on file   ??? Intimate partner violence     Fear of current or ex partner: Not on file     Emotionally abused: Not on file     Physically abused: Not on file  Forced sexual activity: Not on file   Other Topics Concern   ??? Not on file   Social History Narrative   ??? Not on file       Medications/Allergies     Previous Medications    No medications on file     No Known Allergies     Physical Exam       ED Triage Vitals [10/10/19 1554]   BP Temp Temp src Pulse Resp SpO2 Height Weight   -- -- -- -- 18 -- 6' (1.829 m) 175 lb (79.4 kg)     Physical Exam  Vitals signs reviewed.   Constitutional:       General: He is not in acute distress.     Appearance: Normal appearance. He is normal weight. He is not ill-appearing.   HENT:      Head: Normocephalic and atraumatic.      Mouth/Throat:      Mouth: Mucous membranes are moist.      Pharynx: Oropharynx is clear.   Eyes:      Extraocular Movements: Extraocular movements intact.      Conjunctiva/sclera: Conjunctivae normal.      Pupils: Pupils are equal, round, and reactive to light.   Neck:      Musculoskeletal: Normal range of motion and neck supple. No neck rigidity or muscular tenderness.   Cardiovascular:      Rate and Rhythm: Normal rate and regular rhythm.      Pulses: Normal pulses.      Heart sounds: Normal heart sounds.   Pulmonary:      Effort: Pulmonary effort is normal.       Breath sounds: Normal breath sounds.   Chest:      Chest wall: No tenderness.   Abdominal:      Palpations: Abdomen is soft.      Tenderness: There is no abdominal tenderness. There is no guarding or rebound.   Musculoskeletal: Normal range of motion.   Lymphadenopathy:      Cervical: No cervical adenopathy.   Skin:     General: Skin is warm and dry.      Capillary Refill: Capillary refill takes less than 2 seconds.   Neurological:      General: No focal deficit present.      Mental Status: He is alert and oriented to person, place, and time.   Psychiatric:         Mood and Affect: Mood normal.         Behavior: Behavior normal.           Diagnostics   Labs:  No results found for this visit on 10/10/19.  Radiographs:  Xr Ankle Right (min 3 Views)    Result Date: 10/10/2019  Patient Name:  Leona CarrySMITH, Fintan MRN:  [id number] FIN:  [id number] ---Diagnostic Radiology--- Exam Date/Time        10/10/2019 17:16:44 EST                             Exam                  CR Ankle 3+ Views Right                             Ordering Physician    Casimiro NeedleNDERSON, PA-C, JOHN M  Accession Number      [id number]                                       CPT4 Codes 35009 () Reason For Exam lateral discomfort Report RIGHT ANKLE 3 VIEWS CLINICAL INDICATION: lateral discomfort TECHNIQUE: 3 views of the right ankle. COMPARISON: None. FINDINGS: No acute fracture or dislocation. Ankle mortise maintained.  Mild lateral soft tissue edema. IMPRESSION:  1. No acute osseous abnormality.  2. Soft tissue edema. Report Dictated on Workstation: [place] ---** Final ---** Dictated: 10/10/2019 5:18 pm Dictating Physician: Ileene Patrick, MD, [place] Signed Date and Time: 10/10/2019 5:19 pm Signed by: Ileene Patrick, MD, [facility] Transcribed Date and Time: 10/10/2019 5:18    Xr Chest Portable    Result Date: 10/10/2019  Patient Name:  Alec Mcneil MRN:  [id number] FIN:  [id number] ---Diagnostic Radiology--- Exam Date/Time        10/10/2019 17:16:44  EST                             Exam                  CR Chest Portable                                   Ordering Physician    Tery Sanfilippo                              Accession Number      [id number]                                       [place] Codes 78242 () Reason For Exam chest discomfort 2 days, eval for PNA Report CHEST PORTABLE CLINICAL INDICATION: chest discomfort 2 days, eval for PNA TECHNIQUE: Single, portable chest x-ray. COMPARISON: None. FINDINGS: Cardiac and mediastinal silhouette within normal limits. Lungs are grossly clear. No significant vascular congestion. No apparent pleural effusion or pneumothorax. Bony thorax grossly unremarkable. IMPRESSION:  1. No acute consolidation. Report Dictated on Workstation: [place] ---** Final ---** Dictated: 10/10/2019 5:18 pm Dictating Physician: Ileene Patrick, MD, [place] Signed Date and Time: 10/10/2019 5:18 pm Signed by: Ileene Patrick, MD, [facility] Transcribed Date and Time: 10/10/2019 5:18      Procedures:   Procedures    EKG: All EKG's areinterpreted by the Emergency Department Physician in the absence of a cardiologist. Please see their note for interpretation of EKG.    ED Course and MDM      Heart Score for chest pain patients  History: Slightly Suspicious  ECG: Normal  Patient Age: < 45 years  Risk Factors: No risk factors known  Troponin: < 1X normal limit  Heart Score Total: 0    In brief, Celeste Candelas isa 28 y.o. male who presented to the emergency department complaining of chest pain upon awakening yesterday and several days of right ankle pain following an inversion injury.  Chest x-ray and ankle x-ray ordered by triage provider.  Patient given Naprosyn for his pain.  During triage, patient was found to have a "fever" of 101??F using a temporal thermometer.Marland Kitchen Unfortunately, vitals did not go through to  his chart.  We have repeated his temperature shortly after given Naprosyn and his temp is 98.1.  Given his potential fever, COVID swab ordered.  Low  suspicion for systemic infection, sepsis.    Chest x-ray is benign.  No acute findings.  No signs of pneumonia, effusion, pneumothorax.  Patient's heart score is essentially 0 with absolutely no risk factors.  No personal cardiac or lung history.  No history of blood clots, no recent travel.  Not tachycardic. PERC negative.   Electrocardiogram shows normal sinus rhythm, no ST elevation or depression.  No history of fever, IV drug use.  Pain is not worse or better with lying down or sitting up.  No risk factors for ACS, PE, pericarditis.    Imaging of the right ankle show no acute fractures, dislocations.  Suspicious for ankle sprain.  Patient instructed to rest, ice, elevate.  Patient encouraged to take Tylenol/ibuprofen for pain.  Given strict return precautions.  His vitals are stable, afebrile, not tachycardic.  Oxygen saturation 100% room air.  Patient is well-appearing.  All questions addressed.  Patient discharged in stable condition.    I estimate there is LOW risk for (including but not limited to) RETICULOCYTOSIS, ACUTE CHEST SYNDROME, ACUTE CORONARY SYNDROME, PULMONARY EMBOLISM, PNEUMOTHORAX, RUPTURED ESOPHAGUS OR THORACIC AORTIC DISSECTION, thus I consider the discharge disposition reasonable. Carma Lair (or their surrogate) and I have discussed the diagnosis and risks, and we agree with discharging home with close follow-up. We also discussed returning to the Emergency Department immediately if new or worsening symptoms occur. We have discussed the symptoms which are most concerning that necessitate immediate return.    ED Medication Orders (From admission, onward)    Start Ordered     Status Ordering Provider    10/10/19 1600 10/10/19 1559  naproxen (NAPROSYN) tablet 500 mg  ONCE      Last MAR action:  Given - by Ronal Fear on 10/10/19 at 1639 Donald Prose          Final Impression      1. Chest pain, unspecified type    2. Sprain of right ankle, unspecified ligament, initial encounter         DISPOSITION Decision To Discharge 10/10/2019 05:28:10 PM     (Please note that portions of this note may have been completed with a voice recognition program. Efforts were made to edit the dictations but occasionally words aremis-transcribed.)    (Sasha)  Juliet Rude, PA-C  [employer]       [place], [place]  10/10/19 1736

## 2019-10-10 NOTE — ED Triage Notes (Signed)
Placed on cardiac and SPO2 monitors, skin warm, dry, appropriate color for ethnicity. Speaks in full sentences, no respiratory distress.

## 2019-10-10 NOTE — ED Triage Notes (Cosign Needed)
Patient seen initially as clinician in traige.  Initial history and brief exam is performed.  Initial workup is ordered  to develop basic plan of care, and patient will be taken to the main emergency department for further evaluation and treatment. Patient will be seen by my clinician partner. Please see subsequent provider note for further details and disposition.     Patient presents to the ED for a CC of chest discomfort as a sharp pain midsternal RIGHT side has been going on since yesterday.  History this happen before no cardiac history denies any shortness of breath cough. No abdominal pain. Patient also states he has RIGHT ankle pain on the lateral aspect after he rolled it while he is walking a few days ago. Denies any paresthesias.    FOCUSED PHYSICAL EXAMINATION IN TRAIGE  Well-appearing, nontoxic appearing, regular rate and rhythm, lungs are auscultation, appropriate range of motion within the ankle some discomfort to palpation anterior lateral aspect of the lateral malleolus may be tendon involvement. Normal sensation palpation of distal extremity    Plan: Patient may have ligament injury from rolled his ankle, will order x-ray. Further evaluation of patient's mild elevation in temperature with chest x-ray to rule out pneumonia. Patient wanted me to call his emergency contact that he came here to the emergency department which was performed.  Further evaluation in main emergency department      Denita Lung PA-C  Physician Assistant

## 2019-10-10 NOTE — Other (Unsigned)
Patient Acct Nbr: [id number]   Primary AUTH/CERT:   Primary Insurance Company Name: Self [employer]  Primary Insurance Plan name: Self Pay  Primary Insurance Group Number:   Primary Insurance Plan Type: Health  Primary Insurance Policy Number:

## 2019-10-10 NOTE — Discharge Instructions (Signed)
Take Tylenol/Ibuprofen as needed for pain.  Rest, ice, elevate your ankle.  Follow up on MyChart for your COVID test results in the next 5-10 days.  Return to the emergency room if symptoms worsen.

## 2019-10-11 LAB — COVID-19: SARS-CoV-2: DETECTED — AB

## 2020-02-28 ENCOUNTER — Inpatient Hospital Stay
Admit: 2020-02-28 | Discharge: 2020-02-28 | Disposition: A | Discharge status: Home / Self Care | Payer: PRIVATE HEALTH INSURANCE

## 2020-02-28 ENCOUNTER — Emergency Department: Admit: 2020-02-28 | Payer: PRIVATE HEALTH INSURANCE

## 2020-02-28 DIAGNOSIS — S39012A Strain of muscle, fascia and tendon of lower back, initial encounter: Secondary | ICD-10-CM

## 2020-02-28 LAB — URINALYSIS
Bilirubin Urine: NEGATIVE
Blood, Urine: NEGATIVE
Glucose, Ur: NEGATIVE mg/dL
Ketones, Urine: NEGATIVE mg/dL
Leukocyte Esterase, Urine: NEGATIVE
Nitrite, Urine: NEGATIVE
Protein, UA: NEGATIVE mg/dL
Specific Gravity, UA: 1.025 (ref 1.005–1.030)
Urobilinogen, Urine: 1 E.U./dL (ref <2.0)
pH, UA: 6 (ref 5.0–9.0)

## 2020-02-28 LAB — RAPID INFLUENZA A/B ANTIGENS
Influenza A by PCR: NOT DETECTED
Influenza B by PCR: NOT DETECTED

## 2020-02-28 LAB — STREP SCREEN GROUP A THROAT: Strep Grp A PCR: NEGATIVE

## 2020-02-28 MED ORDER — CYCLOBENZAPRINE HCL 5 MG PO TABS
5 MG | ORAL_TABLET | Freq: Two times a day (BID) | ORAL | 0 refills | Status: AC | PRN
Start: 2020-02-28 — End: 2020-03-09

## 2020-02-28 MED ORDER — ALBUTEROL SULFATE HFA 108 (90 BASE) MCG/ACT IN AERS
108 (90 Base) MCG/ACT | Freq: Four times a day (QID) | RESPIRATORY_TRACT | 0 refills | Status: AC | PRN
Start: 2020-02-28 — End: 2020-03-29

## 2020-02-28 MED ORDER — BENZONATATE 100 MG PO CAPS
100 MG | ORAL_CAPSULE | Freq: Two times a day (BID) | ORAL | 0 refills | Status: AC | PRN
Start: 2020-02-28 — End: 2020-03-06

## 2020-02-28 MED ORDER — AZITHROMYCIN 250 MG PO TABS
250 MG | PACK | ORAL | 0 refills | Status: AC
Start: 2020-02-28 — End: 2020-03-03

## 2020-02-28 NOTE — ED Notes (Signed)
workers comp      [place], [place]  02/28/20 1308

## 2020-02-28 NOTE — Discharge Instructions (Signed)
Complete all antibiotic unless advised by healthcare provider or adverse reaction occurs.  Return for any new, changing, worsening symptoms or concerns.

## 2020-02-28 NOTE — ED Provider Notes (Signed)
[facility]  Department of Emergency Medicine   ED  Encounter Note  Admit Date/RoomTime: 02/28/2020  2:31 PM  ED Room: 02/02    NAME: Alec Mcneil  DOB: 1991/02/11  MRN: [id number]     Chief Complaint:  URI (onset yesterday intermittent cough, runny nose, called off work  and needs excuse) and Back Pain (lower back pain onset yesterday, no injury)    History of Present Illness        Alec Mcneil is a 29 y.o. old male who presents to the emergency department for complaint of cough, congestion, runny nose, and back pain.  He reports that he has missed work for the past 2 days and will need a work excuse to return.  He reports that he was Covid tested 2 weeks ago and found to be negative.  Denies any concern for Covid infection at this time.  Nuys injury/trauma.  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  Denies fever, chills, dysuria, urinary frequency, hematuria, constipation, diarrhea, nausea, vomiting, difficulty swallowing, neck pain/stiffness, headache, chest pain, shortness of breath, lightheadedness, dizziness, syncope, or any other complaints at this time.  Ports history of asthma, but takes no medications at this time.  Reports history of bronchitis.  Denies tobacco use. No recent or prior history of back surgery, interventional back procedures such as epidural or facet injections, fevers or chills, genital or perianal paresthesia, lower extremity paresthesia, incontinence of bowel or bladder, plasma donation, hemodialysis or history of IV drug use. Since onset the symptoms have been waxing and waning and moderate in severity.  The symptoms are aggravated by nothing and relieved by nothing.     ROS   Pertinent positives and negatives are stated within HPI, all other systems reviewed and are negative.    Past Medical History:  has a past medical history of Asthma.    Surgical History:  has no past surgical history on file.    Social History:  reports  that he has never smoked. He has never used smokeless tobacco. He reports current alcohol use. He reports that he does not use drugs.    Family History: family history is not on file.     Allergies: Patient has no known allergies.    Physical Exam   Oxygen Saturation Interpretation: Normal.        ED Triage Vitals   BP Temp Temp Source Pulse Resp SpO2 Height Weight   02/28/20 1308 02/28/20 1308 02/28/20 1308 02/28/20 1308 02/28/20 1308 02/28/20 1308 02/28/20 1446 02/28/20 1446   123/79 97.5 ??F (36.4 ??C) Temporal 76 16 98 % 6' (1.829 m) 175 lb (79.4 kg)         Constitutional:  Alert, development consistent with age.  HEENT:  NC/NT.  Airway patent.  Neck:  Normal ROM.  Supple.  Respiratory:  Breath sounds: equal bilaterally.        Lung sounds: normal.   CV:  Regular rate and rhythm, normal heart sounds, without pathological murmurs, ectopy, gallops, or rubs..  GI:  Abdomen Soft, nontender, good bowel sounds.  No firm or pulsatile mass.  Back: Mild tenderness noted to bilateral lumbar region.  No midline tenderness.  No swelling.  Full range of motion.  No CVA tenderness. Negative bilateral leg raise  Integument:  Normal turgor.  Warm, dry, without visible rash.  Lymphatic:  Edema:  none Bilateral lower extremity(s).  Neurological:  Oriented.  Motor functions intact.    Lab / Imaging Results   (All laboratory and radiology results have been personally reviewed by myself)  Labs:  Results for orders placed or performed during the hospital encounter of 02/28/20   Strep Screen Group A Throat    Specimen: Throat   Result Value Ref Range    Strep Grp A PCR Negative Negative   Rapid influenza A/B antigens    Specimen: Nasopharyngeal   Result Value Ref Range    Influenza A by PCR Not Detected Not Detected    Influenza B by PCR Not Detected Not Detected   Urinalysis   Result Value Ref Range    Color, UA Yellow Straw/Yellow    Clarity, UA Clear Clear    Glucose, Ur Negative Negative mg/dL    Bilirubin Urine Negative Negative     Ketones, Urine Negative Negative mg/dL    Specific Gravity, UA 1.025 1.005 - 1.030    Blood, Urine Negative Negative    pH, UA 6.0 5.0 - 9.0    Protein, UA Negative Negative mg/dL    Urobilinogen, Urine 1.0 <2.0 E.U./dL    Nitrite, Urine Negative Negative    Leukocyte Esterase, Urine Negative Negative     Imaging:  All Radiology results interpreted by Radiologist unless otherwise noted.  XR CHEST (2 VW)   Final Result   No acute cardiopulmonary abnormality.         XR LUMBAR SPINE (2-3 VIEWS)   Final Result   Slight anterior subluxation of L5 with respect S1.  Consider bilateral   obliques views to evaluate for spondylitic defects             ED Course / Medical Decision Making   Medications - No data to display     Consult(s):   None    Procedure(s):   none    Medical Decision Making:   ??? Alec Mcneil is a 29 year old male who presented to the emergency department for complaint of cough, congestion, and low back pain.  He reported being tested 2 weeks ago for COVID-19 and was found to be negative.  He declined any offer for repeat testing.  No loss of smell or taste.  No fever or chills.  No chest pain or dyspnea.  He reported history of asthma and bronchitis.  There was no hypoxia or wheezing noted.  No urinary complaints.  Denied injury/trauma.  Influenza A&B negative.  Rapid strep negative.  Urinalysis negative for any signs or concerns for UTI.  On physical exam he was noted to have mild to moderate erythema to his oropharynx.  No hypertrophy or exudate.  Airway was grossly patent.  Chest x-ray negative for any acute infectious process.  Imaging of his lumbar spine showed slight anterior subluxation of L5 with respect to S1.  The spaces were otherwise unremarkable.  No signs or concerns for cauda equina.  No neurovascular deficits.  No motor or sensory deficits.  He ambulated in the ED with a steady gait.  He reported that his job does require extensive lifting.  He remained hemodynamically stable, not  hypoxic, nontoxic, and appropriate for outpatient management.  He was prescribed Flexeril, Zithromax, and Tessalon Perles.  His albuterol inhaler was renewed.  He was instructed to have close follow-up with his PCP and advised to return for any new, change, worsening symptoms or concerns.  At this time the patient is without objective evidence of an acute process requiring hospitalization or inpatient management.  They have  remained hemodynamically stable throughout their entire ED visit and are stable for discharge with outpatient follow-up.     ??? The plan has been discussed in detail and they are aware of the specific conditions for emergent return, as well as the importance of follow-up.      Plan of Care: Normal progression of disease discussed.  All questions answered.  Instruction provided in the use of fluids, vaporizer, acetaminophen, and other OTC medication for symptom control.  Extra fluids  Analgesics as needed, dose reviewed.  Follow-up in 3 days, or sooner should symptoms worsen.     Counseling:  I reviewed today's visit with the patient in addition to providing specific details for the plan of care and counseling regarding the diagnosis and prognosis.  Questions are answered at this time and are agreeable with the plan.    Assessment      1. Bronchitis    2. Strain of lumbar region, initial encounter    3. Mild intermittent asthma without complication      Plan   Discharge home.  Patient condition is good    New Medications     Discharge Medication List as of 02/28/2020  5:21 PM      START taking these medications    Details   azithromycin (ZITHROMAX Z-PAK) 250 MG tablet Take 2 tablets (500 mg) on Day 1, and then take 1 tablet (250 mg) on days 2 through 5., Disp-1 packet, R-0Print      benzonatate (TESSALON) 100 MG capsule Take 1 capsule by mouth 2 times daily as needed for Cough, Disp-14 capsule, R-0Print      cyclobenzaprine (FLEXERIL) 5 MG tablet Take 1 tablet by mouth 2 times daily as needed for  Muscle spasms, Disp-10 tablet, R-0Print           Electronically signed by Brayton El, APRN - CNP   DD: 02/28/20  **This report was transcribed using voice recognition software. Every effort was made to ensure accuracy; however, inadvertent computerized transcription errors may be present.  END OF ED PROVIDER NOTE      Brayton El, APRN - CNP  02/28/20 1930
